# Patient Record
Sex: Female | Born: 1975 | Race: White | Hispanic: No | Smoking: Never smoker
Health system: Southern US, Community
[De-identification: ages and names within clinical notes are randomized; demographics above are authoritative.]

## PROBLEM LIST (undated history)

## (undated) DIAGNOSIS — Z789 Other specified health status: Secondary | ICD-10-CM

## (undated) HISTORY — PX: BREAST ENHANCEMENT SURGERY: SHX7

## (undated) HISTORY — PX: OTHER SURGICAL HISTORY: SHX169

---

## 1998-11-27 ENCOUNTER — Inpatient Hospital Stay (HOSPITAL_COMMUNITY): Admission: AD | Admit: 1998-11-27 | Discharge: 1998-11-27 | Payer: Self-pay | Admitting: Obstetrics and Gynecology

## 1998-11-27 ENCOUNTER — Inpatient Hospital Stay (HOSPITAL_COMMUNITY): Admission: AD | Admit: 1998-11-27 | Discharge: 1998-11-30 | Payer: Self-pay | Admitting: Obstetrics and Gynecology

## 1999-01-02 ENCOUNTER — Other Ambulatory Visit: Admission: RE | Admit: 1999-01-02 | Discharge: 1999-01-02 | Payer: Self-pay | Admitting: Obstetrics and Gynecology

## 1999-01-21 ENCOUNTER — Encounter (HOSPITAL_COMMUNITY): Admission: RE | Admit: 1999-01-21 | Discharge: 1999-04-21 | Payer: Self-pay | Admitting: Obstetrics and Gynecology

## 1999-01-22 ENCOUNTER — Encounter: Payer: Self-pay | Admitting: Family Medicine

## 1999-01-22 ENCOUNTER — Ambulatory Visit (HOSPITAL_COMMUNITY): Admission: RE | Admit: 1999-01-22 | Discharge: 1999-01-22 | Payer: Self-pay | Admitting: Family Medicine

## 2000-01-06 ENCOUNTER — Other Ambulatory Visit: Admission: RE | Admit: 2000-01-06 | Discharge: 2000-01-06 | Payer: Self-pay | Admitting: Obstetrics and Gynecology

## 2000-11-30 ENCOUNTER — Other Ambulatory Visit: Admission: RE | Admit: 2000-11-30 | Discharge: 2000-11-30 | Payer: Self-pay | Admitting: Family Medicine

## 2002-01-18 ENCOUNTER — Other Ambulatory Visit: Admission: RE | Admit: 2002-01-18 | Discharge: 2002-01-18 | Payer: Self-pay | Admitting: Family Medicine

## 2003-10-17 ENCOUNTER — Other Ambulatory Visit: Admission: RE | Admit: 2003-10-17 | Discharge: 2003-10-17 | Payer: Self-pay | Admitting: Obstetrics and Gynecology

## 2004-05-07 ENCOUNTER — Inpatient Hospital Stay (HOSPITAL_COMMUNITY): Admission: AD | Admit: 2004-05-07 | Discharge: 2004-05-09 | Payer: Self-pay | Admitting: Obstetrics and Gynecology

## 2004-06-12 ENCOUNTER — Other Ambulatory Visit: Admission: RE | Admit: 2004-06-12 | Discharge: 2004-06-12 | Payer: Self-pay | Admitting: Obstetrics and Gynecology

## 2005-08-25 ENCOUNTER — Other Ambulatory Visit: Admission: RE | Admit: 2005-08-25 | Discharge: 2005-08-25 | Payer: Self-pay | Admitting: Family Medicine

## 2006-09-21 ENCOUNTER — Other Ambulatory Visit: Admission: RE | Admit: 2006-09-21 | Discharge: 2006-09-21 | Payer: Self-pay | Admitting: Family Medicine

## 2007-10-21 ENCOUNTER — Other Ambulatory Visit: Admission: RE | Admit: 2007-10-21 | Discharge: 2007-10-21 | Payer: Self-pay | Admitting: Family Medicine

## 2008-03-02 ENCOUNTER — Encounter: Admission: RE | Admit: 2008-03-02 | Discharge: 2008-03-02 | Payer: Self-pay | Admitting: Gastroenterology

## 2008-03-15 ENCOUNTER — Emergency Department (HOSPITAL_COMMUNITY): Admission: EM | Admit: 2008-03-15 | Discharge: 2008-03-15 | Payer: Self-pay | Admitting: Emergency Medicine

## 2008-03-19 ENCOUNTER — Emergency Department (HOSPITAL_COMMUNITY): Admission: EM | Admit: 2008-03-19 | Discharge: 2008-03-19 | Payer: Self-pay | Admitting: Emergency Medicine

## 2009-03-19 IMAGING — CT CT ANGIO CHEST
1 of 2 series · 20 of 30 positions shown · IV contrast (APPLIED)
Comparison: Report of a prior study 01/22/1999

CLINICAL DATA: Chest pain.

CT ANGIOGRAPHY CHEST
TECHNIQUE: Multidetector CT imaging of the chest using the
standard protocol during bolus administration of intravenous
contrast. Multiplanar reconstructed images obtained and reviewed to
evaluate the vascular anatomy.
Contrast: 80 ml 0mnipaque-DUU

[Series 6: pe 1.0 b20f st · axial · 0.63mm/px · z∈[+1069,+1359]mm · 20 of 324 slices shown]
[im 17/324  lung]
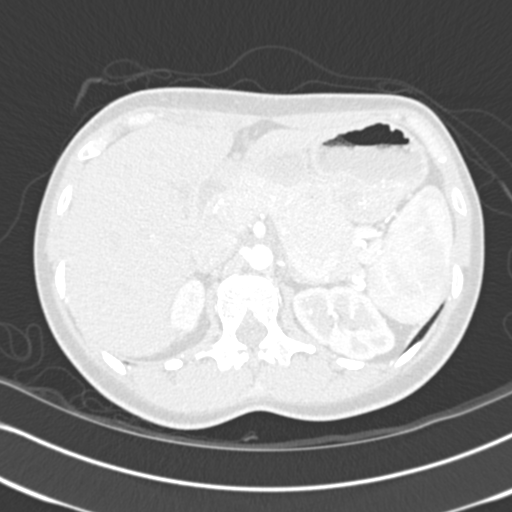
[im 33/324  mediastinal]
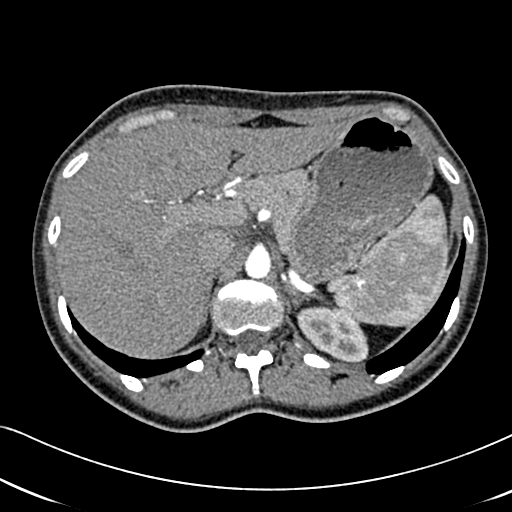
[im 49/324  lung]
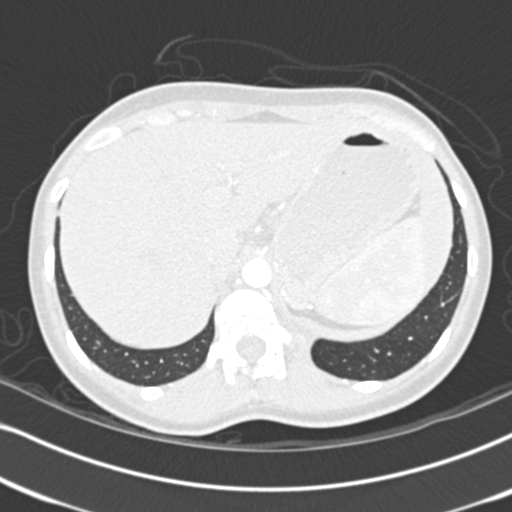
[im 65/324  mediastinal]
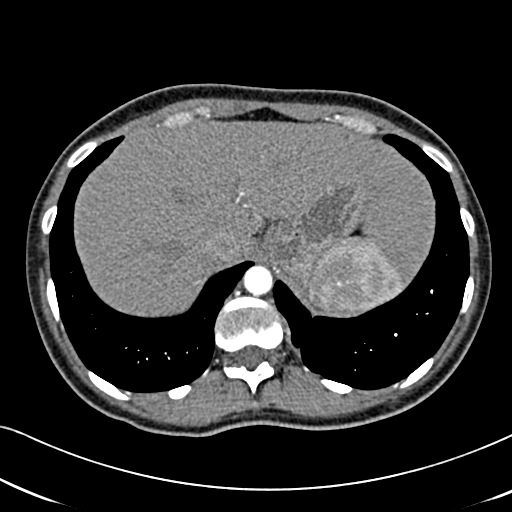
[im 81/324  lung]
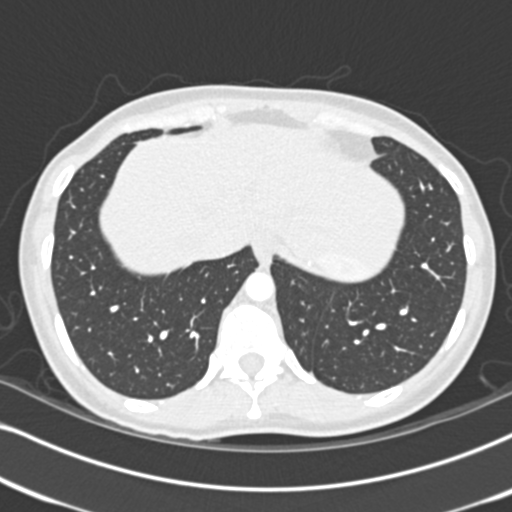
[im 97/324  mediastinal]
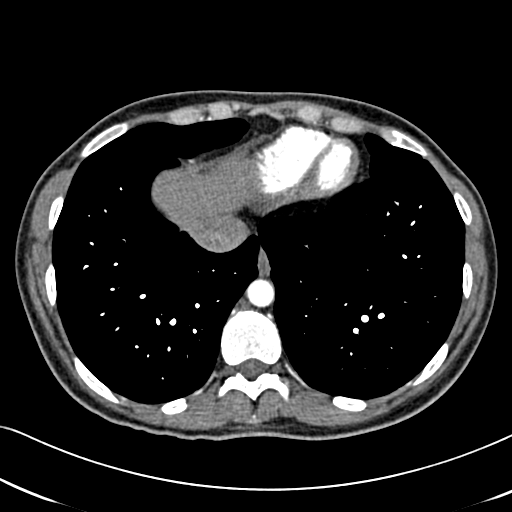
[im 114/324  lung]
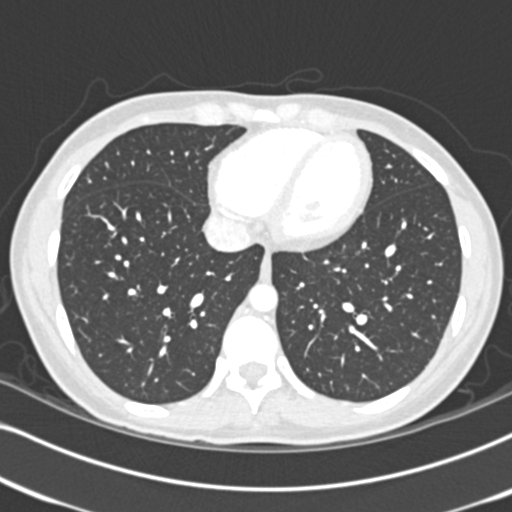
[im 130/324  mediastinal]
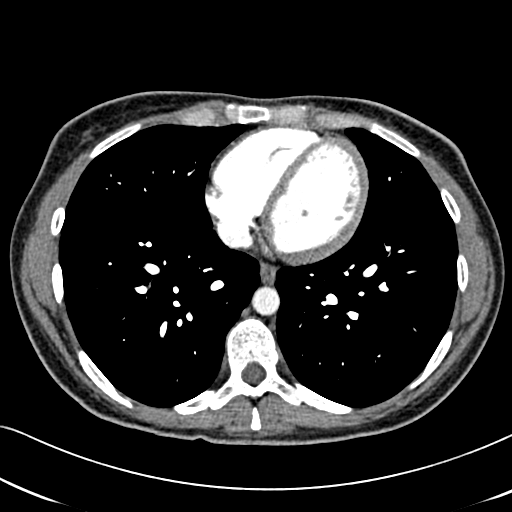
[im 146/324  lung]
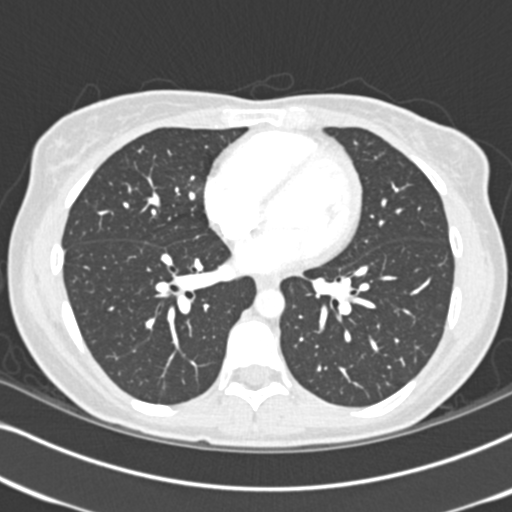
[im 153/324  mediastinal]
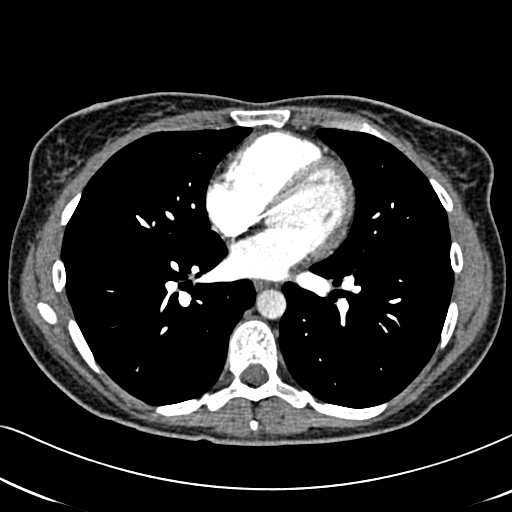
[im 162/324  lung]
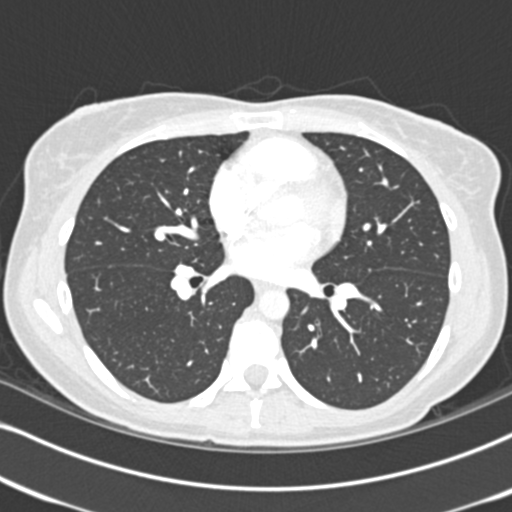
[im 178/324  mediastinal]
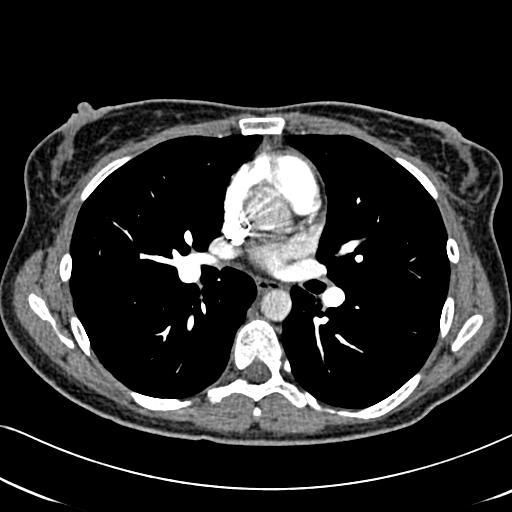
[im 194/324  lung]
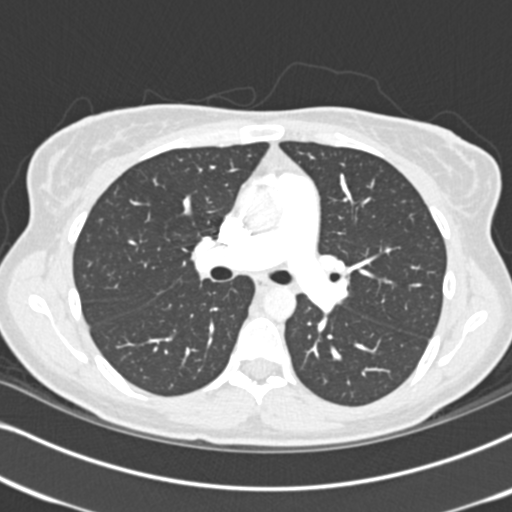
[im 210/324  mediastinal]
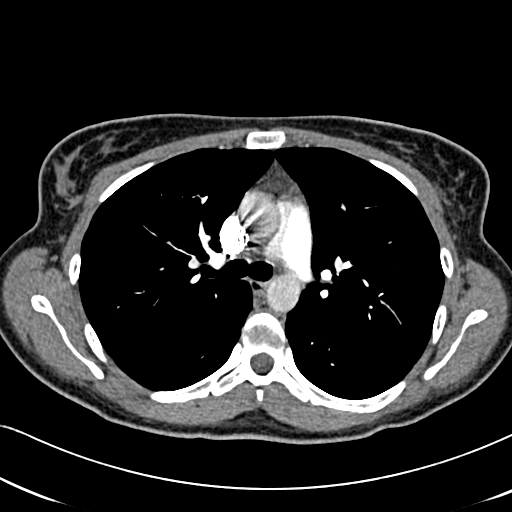
[im 227/324  lung]
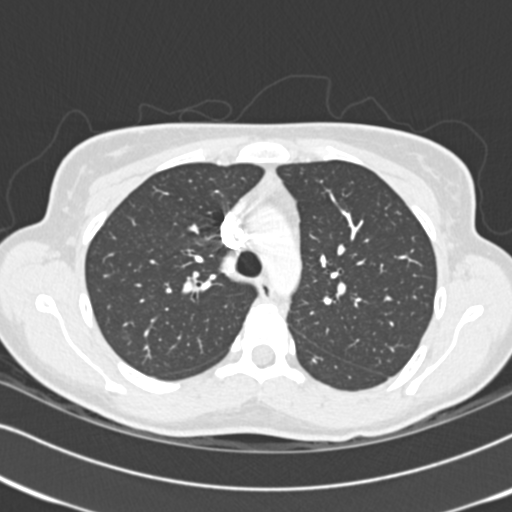
[im 243/324  mediastinal]
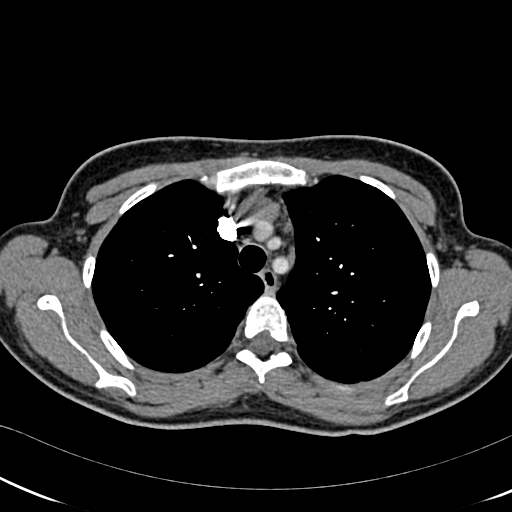
[im 259/324  lung]
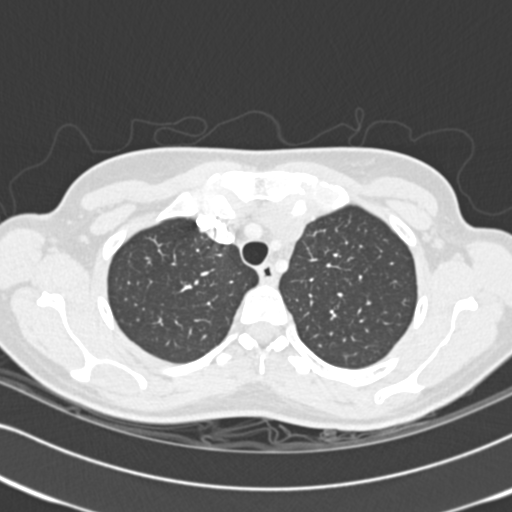
[im 275/324  mediastinal]
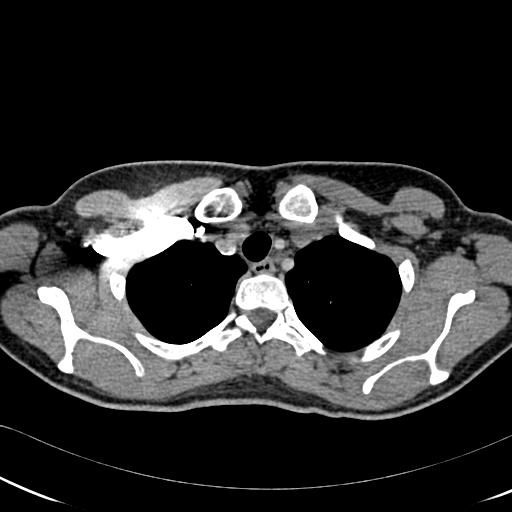
[im 291/324  lung]
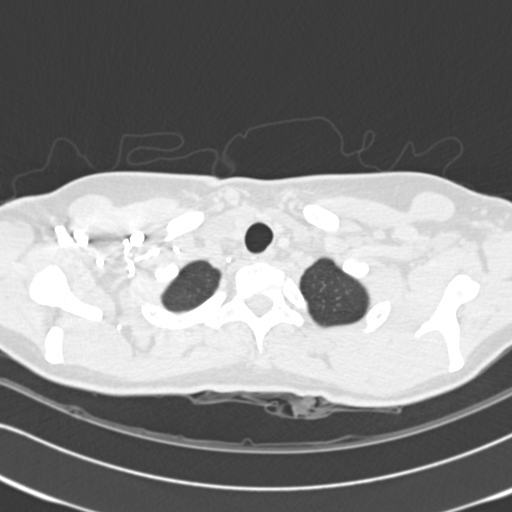
[im 307/324  mediastinal]
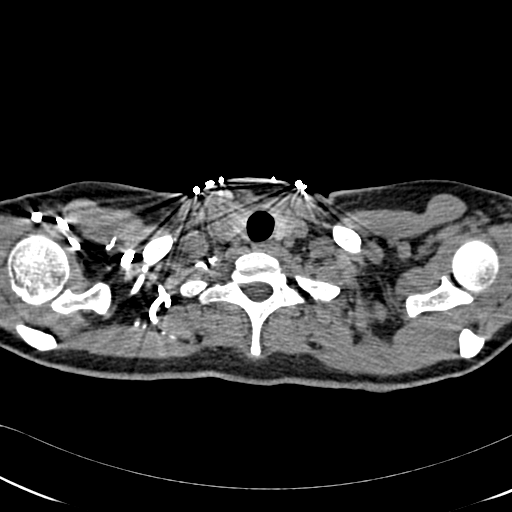

[20 of 30 positions shown; findings below may reference images not displayed]

FINDINGS: In three planes there are no pulmonary emboli.  No
pleural or pericardial fluid.  No mediastinal or hilar adenopathy.
No enlarged axillary nodes or chest wall masses.

No dissection or focal aneurysm of the thoracic aorta. The lungs
are clear.  No osseous lesions.
IMPRESSION: 1.  No pulmonary emboli.
2.  Aorta normal.
3.  Lungs clear.
4.  No other acute or significant findings.

## 2010-04-01 ENCOUNTER — Other Ambulatory Visit: Admission: RE | Admit: 2010-04-01 | Discharge: 2010-04-01 | Payer: Self-pay | Admitting: Family Medicine

## 2010-05-08 ENCOUNTER — Encounter: Admission: RE | Admit: 2010-05-08 | Discharge: 2010-05-29 | Payer: Self-pay | Admitting: Family Medicine

## 2010-08-25 ENCOUNTER — Encounter: Payer: Self-pay | Admitting: Gastroenterology

## 2010-09-04 ENCOUNTER — Encounter: Payer: Self-pay | Admitting: Orthopedic Surgery

## 2010-10-08 ENCOUNTER — Other Ambulatory Visit: Payer: Self-pay | Admitting: Orthopedic Surgery

## 2010-10-08 DIAGNOSIS — M25532 Pain in left wrist: Secondary | ICD-10-CM

## 2010-10-11 ENCOUNTER — Ambulatory Visit
Admission: RE | Admit: 2010-10-11 | Discharge: 2010-10-11 | Disposition: A | Discharge status: Home / Self Care | Payer: BC Managed Care – PPO | Source: Ambulatory Visit | Attending: Orthopedic Surgery | Admitting: Orthopedic Surgery

## 2010-10-11 DIAGNOSIS — M25532 Pain in left wrist: Secondary | ICD-10-CM

## 2011-05-02 LAB — D-DIMER, QUANTITATIVE: D-Dimer, Quant: 1.8 — ABNORMAL HIGH

## 2012-01-08 ENCOUNTER — Emergency Department (HOSPITAL_COMMUNITY)
Admission: EM | Admit: 2012-01-08 | Discharge: 2012-01-08 | Disposition: A | Discharge status: Home / Self Care | Payer: BC Managed Care – PPO | Attending: Emergency Medicine | Admitting: Emergency Medicine

## 2012-01-08 ENCOUNTER — Emergency Department (HOSPITAL_COMMUNITY): Payer: BC Managed Care – PPO

## 2012-01-08 ENCOUNTER — Encounter (HOSPITAL_COMMUNITY): Payer: Self-pay | Admitting: Emergency Medicine

## 2012-01-08 DIAGNOSIS — R079 Chest pain, unspecified: Secondary | ICD-10-CM | POA: Insufficient documentation

## 2012-01-08 DIAGNOSIS — E876 Hypokalemia: Secondary | ICD-10-CM

## 2012-01-08 DIAGNOSIS — R10819 Abdominal tenderness, unspecified site: Secondary | ICD-10-CM | POA: Insufficient documentation

## 2012-01-08 DIAGNOSIS — K2970 Gastritis, unspecified, without bleeding: Secondary | ICD-10-CM

## 2012-01-08 DIAGNOSIS — R109 Unspecified abdominal pain: Secondary | ICD-10-CM

## 2012-01-08 LAB — HEPATIC FUNCTION PANEL
ALT: 32 U/L (ref 0–35)
AST: 48 U/L — ABNORMAL HIGH (ref 0–37)
Alkaline Phosphatase: 42 U/L (ref 39–117)
Bilirubin, Direct: 0.2 mg/dL (ref 0.0–0.3)
Indirect Bilirubin: 0.4 mg/dL (ref 0.3–0.9)
Total Protein: 6.8 g/dL (ref 6.0–8.3)

## 2012-01-08 LAB — DIFFERENTIAL
Basophils Relative: 0 % (ref 0–1)
Eosinophils Absolute: 0 10*3/uL (ref 0.0–0.7)
Lymphocytes Relative: 11 % — ABNORMAL LOW (ref 12–46)
Monocytes Absolute: 0.3 10*3/uL (ref 0.1–1.0)
Monocytes Relative: 2 % — ABNORMAL LOW (ref 3–12)
Neutro Abs: 12.5 10*3/uL — ABNORMAL HIGH (ref 1.7–7.7)
Neutrophils Relative %: 86 % — ABNORMAL HIGH (ref 43–77)

## 2012-01-08 LAB — BASIC METABOLIC PANEL
BUN: 13 mg/dL (ref 6–23)
Creatinine, Ser: 0.83 mg/dL (ref 0.50–1.10)
GFR calc Af Amer: >90 mL/min (ref >90)
GFR calc non Af Amer: >90 mL/min (ref >90)
Glucose, Bld: 125 mg/dL — ABNORMAL HIGH (ref 70–99)

## 2012-01-08 LAB — CBC
Platelets: 176 10*3/uL (ref 150–400)
RDW: 13.1 % (ref 11.5–15.5)
WBC: 14.6 10*3/uL — ABNORMAL HIGH (ref 4.0–10.5)

## 2012-01-08 LAB — POCT PREGNANCY, URINE: Preg Test, Ur: NEGATIVE

## 2012-01-08 LAB — LIPASE, BLOOD: Lipase: 63 U/L — ABNORMAL HIGH (ref 11–59)

## 2012-01-08 MED ORDER — POTASSIUM CHLORIDE 10 MEQ/100ML IV SOLN
10.0000 meq | Freq: Once | INTRAVENOUS | Status: AC
  Administered 2012-01-08: 10 meq via INTRAVENOUS
  Filled 2012-01-08: qty 100

## 2012-01-08 MED ORDER — LANSOPRAZOLE 30 MG PO TBDP: 30.0000 mg | ORAL_TABLET | Freq: Every day | ORAL | Status: DC

## 2012-01-08 MED ORDER — POTASSIUM CHLORIDE CRYS ER 20 MEQ PO TBCR
40.0000 meq | EXTENDED_RELEASE_TABLET | Freq: Once | ORAL | Status: AC
  Administered 2012-01-08: 40 meq via ORAL
  Filled 2012-01-08: qty 2

## 2012-01-08 MED ORDER — PANTOPRAZOLE SODIUM 40 MG IV SOLR
40.0000 mg | Freq: Once | INTRAVENOUS | Status: AC
  Administered 2012-01-08: 40 mg via INTRAVENOUS
  Filled 2012-01-08: qty 40

## 2012-01-08 MED ORDER — POTASSIUM CHLORIDE CRYS ER 20 MEQ PO TBCR: 40.0000 meq | EXTENDED_RELEASE_TABLET | Freq: Every day | ORAL | Status: DC

## 2012-01-08 MED ORDER — MORPHINE SULFATE 4 MG/ML IJ SOLN
4.0000 mg | Freq: Once | INTRAMUSCULAR | Status: AC
  Administered 2012-01-08: 4 mg via INTRAVENOUS
  Filled 2012-01-08: qty 1

## 2012-01-08 MED ORDER — IOHEXOL 300 MG/ML  SOLN
80.0000 mL | Freq: Once | INTRAMUSCULAR | Status: AC | PRN
  Administered 2012-01-08: 80 mL via INTRAVENOUS

## 2012-01-08 MED ORDER — ONDANSETRON HCL 4 MG/2ML IJ SOLN
4.0000 mg | Freq: Once | INTRAMUSCULAR | Status: AC
  Administered 2012-01-08: 4 mg via INTRAVENOUS
  Filled 2012-01-08: qty 2

## 2012-01-08 MED ORDER — IOHEXOL 300 MG/ML  SOLN
20.0000 mL | INTRAMUSCULAR | Status: AC
  Administered 2012-01-08: 20 mL via ORAL

## 2012-01-08 MED ORDER — LORAZEPAM 2 MG/ML IJ SOLN
1.0000 mg | Freq: Once | INTRAMUSCULAR | Status: AC
  Administered 2012-01-08: 1 mg via INTRAVENOUS
  Filled 2012-01-08: qty 1

## 2012-01-08 NOTE — ED Notes (Signed)
PT. REPORTS MID CHEST PAIN WITH NAUSEA ONSET THIS EVENING , DENIES SOB /COUGH OR DIAPHORESIS.

## 2012-01-08 NOTE — ED Notes (Signed)
Took patient IV out and unhook the monitor and patient is getting dressed

## 2012-01-08 NOTE — ED Notes (Signed)
Patient transported to X-ray 

## 2012-01-08 NOTE — ED Notes (Signed)
States had indigestion like pain last night when she went to bed around 1130 after eating a peanut butter sandwich grilled in butter. States awoke to go to the restroom around 2 am and felt nauseated but did not vomit. States pain became worse and so she came to the ed to be evaluated

## 2012-01-08 NOTE — ED Notes (Signed)
Pt to come to cdu to finish receiving her iv potassium

## 2012-01-08 NOTE — Discharge Instructions (Signed)
Gastritis Gastritis is an inflammation (the body's way of reacting to injury and/or infection) of the stomach. It is often caused by viral or bacterial (germ) infections. It can also be caused by chemicals (including alcohol) and medications. This illness may be associated with generalized malaise (feeling tired, not well), cramps, and fever. The illness may last 2 to 7 days. If symptoms of gastritis continue, gastroscopy (looking into the stomach with a telescope-like instrument), biopsy (taking tissue samples), and/or blood tests may be necessary to determine the cause. Antibiotics will not affect the illness unless there is a bacterial infection present. One common bacterial cause of gastritis is an organism known as H. Pylori. This can be treated with antibiotics. Other forms of gastritis are caused by too much acid in the stomach. They can be treated with medications such as H2 blockers and antacids. Home treatment is usually all that is needed. Young children will quickly become dehydrated (loss of body fluids) if vomiting and diarrhea are both present. Medications may be given to control nausea. Medications are usually not given for diarrhea unless especially bothersome. Some medications slow the removal of the virus from the gastrointestinal tract. This slows down the healing process. HOME CARE INSTRUCTIONS Home care instructions for nausea and vomiting:  For adults: drink small amounts of fluids often. Drink at least 2 quarts a day. Take sips frequently. Do not drink large amounts of fluid at one time. This may worsen the nausea.   Only take over-the-counter or prescription medicines for pain, discomfort, or fever as directed by your caregiver.   Drink clear liquids only. Those are anything you can see through such as water, broth, or soft drinks.   Once you are keeping clear liquids down, you may start full liquids, soups, juices, and ice cream or sherbet. Slowly add bland (plain, not spicy)  foods to your diet.  Home care instructions for diarrhea:  Diarrhea can be caused by bacterial infections or a virus. Your condition should improve with time, rest, fluids, and/or anti-diarrheal medication.   Until your diarrhea is under control, you should drink clear liquids often in small amounts. Clear liquids include: water, broth, jell-o water and weak tea.  Avoid:  Milk.   Fruits.   Tobacco.   Alcohol.   Extremely hot or cold fluids.   Too much intake of anything at one time.  When your diarrhea stops you may add the following foods, which help the stool to become more formed:  Rice.   Bananas.   Apples without skin.   Dry toast.  Once these foods are tolerated you may add low-fat yogurt and low-fat cottage cheese. They will help to restore the normal bacterial balance in your bowel. Wash your hands well to avoid spreading bacteria (germ) or virus. SEEK IMMEDIATE MEDICAL CARE IF:   You are unable to keep fluids down.   Vomiting or diarrhea become persistent (constant).   Abdominal pain develops, increases, or localizes. (Right sided pain can be appendicitis. Left sided pain in adults can be diverticulitis.)   You develop a fever (an oral temperature above 102 F (38.9 C)).   Diarrhea becomes excessive or contains blood or mucus.   You have excessive weakness, dizziness, fainting or extreme thirst.   You are not improving or you are getting worse.   You have any other questions or concerns.  Document Released: 07/15/2001 Document Revised: 07/10/2011 Document Reviewed: 07/21/2005 [facility] Patient Information 2012 [place], [place].Foods Rich in Potassium The body needs potassium to:  Control blood pressure.   Keep the muscles healthy.   Keep the nervous system healthy.  Most foods contain potassium. Eating a variety of foods in the right amounts will help control the level of potassium in your body.  Food / Potassium (mg)  Apricots, dried,  cup / 378 mg    Apricots, raw, 1 cup halves / 401 mg   Avocado,  / 487 mg   Banana, 1 large / 487 mg   Beef, lean, round, 3 oz / 202 mg   Cantaloupe, 1 cup cubes / 427 mg   Dates, medjool, 5 whole / 835 mg   Ham, cured, 3 oz / 212 mg   Lentils, dried,  cup / 458 mg   Lima beans, frozen,  cup / 258 mg   Orange, 1 large / 333 mg   Orange juice, 1 cup / 443 mg   Peaches, dried,  cup / 398 mg   Peas, split, cooked,  cup / 355 mg   Potato, boiled, 1 medium / 515 mg   Prunes, dried, uncooked,  cup / 318 mg   Raisins,  cup / 309 mg   Salmon, pink, raw, 3 oz / 275 mg   Sardines, canned , 3 oz / 338 mg   Tomato, raw, 1 medium / 292 mg   Tomato juice, 6 oz / 417 mg   [place], 3 oz / 349 mg  Other Foods High in Potassium (greater than 250 mg):  Bran cereals and other bran products.   Milk (skim, 1%, 2%, whole).   Buttermilk.   Yogurt.   Nuts.   Dried fruits.   Cherries.   Sweet potatoes.   Oranges.   Baked Beans.   Broccoli.   Spinach.   Peanut butter.   Tofu.  Foods Lower in Potassium (less than 250 mg):  Pasta.   Rice.   Cottage cheese.   Cheddar cheese.   Apples.   Mango.   Grapes.   Grapefruit.   Pineapple.   Raspberries.   Strawberries.   Watermelon.   Green Beans.   Cabbage.   Carrots.   Cauliflower.   Celery.   Corn.    Mushrooms.   Onions.   Squash.   Eggs.  The list below tells you how big or small some common portion sizes are:  1 oz.........4 stacked dice.   3 oz........Marland KitchenDeck of cards.   1 tsp.......Marland KitchenTip of little finger.   1 tbs......Marland KitchenMarland KitchenThumb.   2 tbs.......Marland KitchenGolf ball.    cup......Marland KitchenHalf of a fist.   1 cup.......Marland KitchenA fist.  Document Released: 01/07/2008 Document Revised: 04/02/2011 Document Reviewed: 12/04/2008 [facility] Patient Information 2012 [place], [place].

## 2012-01-08 NOTE — ED Provider Notes (Signed)
History     CSN: [id number]  Arrival date & time 01/08/12  7564   First MD Initiated Contact with Patient 01/08/12 0354      Chief Complaint  Patient presents with  . Chest Pain    (Consider location/radiation/quality/duration/timing/severity/associated sxs/prior treatment) Patient is a 36 y.o. female presenting with chest pain. The history is provided by the patient.  Chest Pain    patient here with epigastric pain x2 days. Notes that she did have a grilled peanut butter sandwich prior to the pain getting worse. Some nausea but no vomiting. Denies any fever. No urinary symptoms. No vaginal bleeding or discharge. No prior history of same. No change in bowel habits. No medications taken for this prior to arrival. Nothing seems to make her symptoms better  History reviewed. No pertinent past medical history.  Past Surgical History  Procedure Date  . Breast enhancement surgery   . Tummy tuck     No family history on file.  History  Substance Use Topics  . Smoking status: Never Smoker   . Smokeless tobacco: Not on file  . Alcohol Use: Yes    OB History    Grav Para Term Preterm Abortions TAB SAB Ect Mult Living                  Review of Systems  Cardiovascular: Positive for chest pain.  All other systems reviewed and are negative.    Allergies  Review of patient's allergies indicates no known allergies.  Home Medications   Current Outpatient Rx  Name Route Sig Dispense Refill  . ADULT MULTIVITAMIN W/MINERALS CH Oral Take 1 tablet by mouth daily.    . TOPIRAMATE 200 MG PO TABS Oral Take 200 mg by mouth at bedtime.      BP 112/43  Pulse 80  Temp(Src) 98 F (36.7 C) (Oral)  Resp 18  SpO2 100%  LMP 12/30/2011  Physical Exam  Nursing note and vitals reviewed. Constitutional: She is oriented to person, place, and time. She appears well-developed and well-nourished.  Non-toxic appearance. No distress.  HENT:  Head: Normocephalic and atraumatic.  Eyes:  Conjunctivae, EOM and lids are normal. Pupils are equal, round, and reactive to light.  Neck: Normal range of motion. Neck supple. No tracheal deviation present. No mass present.  Cardiovascular: Normal rate, regular rhythm and normal heart sounds.  Exam reveals no gallop.   No murmur heard. Pulmonary/Chest: Effort normal and breath sounds normal. No stridor. No respiratory distress. She has no decreased breath sounds. She has no wheezes. She has no rhonchi. She has no rales.  Abdominal: Soft. Normal appearance and bowel sounds are normal. She exhibits no distension. There is tenderness in the right upper quadrant, epigastric area and left upper quadrant. There is no rigidity, no rebound, no guarding and no CVA tenderness.  Musculoskeletal: Normal range of motion. She exhibits no edema and no tenderness.  Neurological: She is alert and oriented to person, place, and time. She has normal strength. No cranial nerve deficit or sensory deficit. GCS eye subscore is 4. GCS verbal subscore is 5. GCS motor subscore is 6.  Skin: Skin is warm and dry. No abrasion and no rash noted.  Psychiatric: She has a normal mood and affect. Her speech is normal and behavior is normal.    ED Course  Procedures (including critical care time)  Labs Reviewed  CBC - Abnormal; Notable for the following:    WBC 14.6 (*)    All other components  within normal limits  DIFFERENTIAL - Abnormal; Notable for the following:    Neutrophils Relative 86 (*)    Neutro Abs 12.5 (*)    Lymphocytes Relative 11 (*)    Monocytes Relative 2 (*)    All other components within normal limits  POCT I-STAT TROPONIN I  BASIC METABOLIC PANEL  LIPASE, BLOOD  HEPATIC FUNCTION PANEL   Dg Chest 2 View  01/08/2012  *RADIOLOGY REPORT*  Clinical Data: Chest pain.  CHEST - 2 VIEW  Comparison: Chest CT 03/15/2008.  Findings: The cardiac silhouette, mediastinal and hilar contours are within normal limits.  The lungs are clear.  No pleural effusion.   The bony thorax is intact.  IMPRESSION: Normal chest x-ray.  Original Report Authenticated By: P. Loralie Champagne, M.D.     No diagnosis found.    MDM  Patient given IV fluids and pain medications. Patient has a leukocytosis and now more diffuse abdominal pain. Abdominal CT is pending.        Toy Baker, MD 01/08/12 [phone]

## 2012-01-08 NOTE — ED Provider Notes (Signed)
The patient was seen by Dr. Freida Busman last evening for trouble with abdominal pain associated with nausea and chest discomfort. The patient was noted to have upper abdominal pain on exam and she had an elevated white blood cell count. I was asked to followup on the CAT scan results. On my repeat exam this morning she has no abdominal tenderness in the right upper quadrant or epigastrium. I doubt that she is having acute cholecystitis. She does have slight elevation in her lipase but there is no evidence of swelling about the pancreas on CT scan. She does have some inflammation in the gastric region. 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 I suspect that her pain may be related to that. The patient will be started on antacids. She has been given potassium replacement here in the emergency department. I've instructed her to follow up with her primary care doctor and her gastroenterologist Dr. Evette Cristal. She may benefit from outpatient endoscopy.  Celene Kras, MD 01/08/12 1044

## 2012-01-08 NOTE — ED Notes (Signed)
Received pt. From triage via w/c, pt. Alert and oriented, NAD noted

## 2012-01-09 LAB — URINE CULTURE
Colony Count: NO GROWTH
Culture  Setup Time: 201306060628

## 2014-01-11 ENCOUNTER — Other Ambulatory Visit: Payer: Self-pay | Admitting: Family Medicine

## 2014-01-11 ENCOUNTER — Other Ambulatory Visit (HOSPITAL_COMMUNITY)
Admission: RE | Admit: 2014-01-11 | Discharge: 2014-01-11 | Disposition: A | Discharge status: Home / Self Care | Payer: BC Managed Care – PPO | Source: Ambulatory Visit | Attending: Family Medicine | Admitting: Family Medicine

## 2014-01-11 DIAGNOSIS — Z113 Encounter for screening for infections with a predominantly sexual mode of transmission: Secondary | ICD-10-CM | POA: Insufficient documentation

## 2014-01-11 DIAGNOSIS — Z1151 Encounter for screening for human papillomavirus (HPV): Secondary | ICD-10-CM | POA: Insufficient documentation

## 2014-01-11 DIAGNOSIS — Z124 Encounter for screening for malignant neoplasm of cervix: Secondary | ICD-10-CM | POA: Insufficient documentation

## 2014-01-16 LAB — CYTOLOGY - PAP

## 2017-12-16 ENCOUNTER — Other Ambulatory Visit: Payer: Self-pay

## 2019-12-19 ENCOUNTER — Ambulatory Visit: Payer: Self-pay | Attending: Internal Medicine

## 2019-12-19 DIAGNOSIS — Z23 Encounter for immunization: Secondary | ICD-10-CM

## 2019-12-19 NOTE — Progress Notes (Signed)
   Covid-19 Vaccination Clinic  Name:  Marissa Cordova    MRN: [id number] DOB: 05-Nov-1975  12/19/2019  [name] was observed post Covid-19 immunization for 15 minutes without incident. She was provided with Vaccine Information Sheet and instruction to access the V-Safe system.   [name] was instructed to call 911 with any severe reactions post vaccine: Marland Kitchen Difficulty breathing  . Swelling of face and throat  . A fast heartbeat  . A bad rash all over body  . Dizziness and weakness   Immunizations Administered    Name Date Dose VIS Date Route   Pfizer COVID-19 Vaccine 12/19/2019  4:58 PM 0.3 mL 09/28/2018 Intramuscular   Manufacturer: [employer], [employer]   Lot: [id number]   NDC: [address]-3

## 2020-12-25 ENCOUNTER — Encounter (HOSPITAL_BASED_OUTPATIENT_CLINIC_OR_DEPARTMENT_OTHER): Payer: Self-pay | Admitting: Orthopedic Surgery

## 2020-12-25 ENCOUNTER — Other Ambulatory Visit: Payer: Self-pay | Admitting: Orthopedic Surgery

## 2020-12-25 ENCOUNTER — Other Ambulatory Visit: Payer: Self-pay

## 2020-12-27 ENCOUNTER — Ambulatory Visit (HOSPITAL_BASED_OUTPATIENT_CLINIC_OR_DEPARTMENT_OTHER): Payer: No Typology Code available for payment source | Admitting: Anesthesiology

## 2020-12-27 ENCOUNTER — Ambulatory Visit (HOSPITAL_BASED_OUTPATIENT_CLINIC_OR_DEPARTMENT_OTHER)
Admission: RE | Admit: 2020-12-27 | Discharge: 2020-12-27 | Disposition: A | Discharge status: Home / Self Care | Payer: No Typology Code available for payment source | Attending: Orthopedic Surgery | Admitting: Orthopedic Surgery

## 2020-12-27 ENCOUNTER — Encounter (HOSPITAL_BASED_OUTPATIENT_CLINIC_OR_DEPARTMENT_OTHER)
Admission: RE | Disposition: A | Discharge status: Home / Self Care | Payer: Self-pay | Source: Home / Self Care | Attending: Orthopedic Surgery

## 2020-12-27 ENCOUNTER — Other Ambulatory Visit: Payer: Self-pay

## 2020-12-27 ENCOUNTER — Encounter (HOSPITAL_BASED_OUTPATIENT_CLINIC_OR_DEPARTMENT_OTHER): Payer: Self-pay | Admitting: Orthopedic Surgery

## 2020-12-27 DIAGNOSIS — X58XXXA Exposure to other specified factors, initial encounter: Secondary | ICD-10-CM | POA: Insufficient documentation

## 2020-12-27 DIAGNOSIS — S66196A Other injury of flexor muscle, fascia and tendon of right little finger at wrist and hand level, initial encounter: Secondary | ICD-10-CM | POA: Diagnosis not present

## 2020-12-27 DIAGNOSIS — S62636A Displaced fracture of distal phalanx of right little finger, initial encounter for closed fracture: Secondary | ICD-10-CM | POA: Diagnosis not present

## 2020-12-27 HISTORY — PX: OPEN REDUCTION INTERNAL FIXATION (ORIF) DISTAL PHALANX: SHX6236

## 2020-12-27 HISTORY — DX: Other specified health status: Z78.9

## 2020-12-27 LAB — POCT PREGNANCY, URINE: Preg Test, Ur: NEGATIVE

## 2020-12-27 SURGERY — OPEN REDUCTION INTERNAL FIXATION (ORIF) DISTAL PHALANX
Anesthesia: General | Site: Finger | Laterality: Right

## 2020-12-27 MED ORDER — LACTATED RINGERS IV SOLN: INTRAVENOUS | Status: DC

## 2020-12-27 MED ORDER — LACTATED RINGERS IV SOLN: INTRAVENOUS | Status: DC | PRN

## 2020-12-27 MED ORDER — FENTANYL CITRATE (PF) 100 MCG/2ML IJ SOLN
INTRAMUSCULAR | Status: AC
  Filled 2020-12-27: qty 2

## 2020-12-27 MED ORDER — MIDAZOLAM HCL 2 MG/2ML IJ SOLN
INTRAMUSCULAR | Status: AC
  Filled 2020-12-27: qty 2

## 2020-12-27 MED ORDER — PROPOFOL 10 MG/ML IV BOLUS
INTRAVENOUS | Status: AC
  Filled 2020-12-27: qty 20

## 2020-12-27 MED ORDER — HYDROCODONE-ACETAMINOPHEN 5-325 MG PO TABS: ORAL_TABLET | ORAL | 0 refills | Status: AC

## 2020-12-27 MED ORDER — DEXAMETHASONE SODIUM PHOSPHATE 10 MG/ML IJ SOLN
INTRAMUSCULAR | Status: DC | PRN
  Administered 2020-12-27: 5 mg via INTRAVENOUS

## 2020-12-27 MED ORDER — OXYCODONE HCL 5 MG/5ML PO SOLN: 5.0000 mg | Freq: Once | ORAL | Status: DC | PRN

## 2020-12-27 MED ORDER — LIDOCAINE HCL (CARDIAC) PF 100 MG/5ML IV SOSY
PREFILLED_SYRINGE | INTRAVENOUS | Status: DC | PRN
  Administered 2020-12-27: 50 mg via INTRAVENOUS

## 2020-12-27 MED ORDER — OXYCODONE HCL 5 MG PO TABS: 5.0000 mg | ORAL_TABLET | Freq: Once | ORAL | Status: DC | PRN

## 2020-12-27 MED ORDER — CEFAZOLIN SODIUM-DEXTROSE 2-4 GM/100ML-% IV SOLN
INTRAVENOUS | Status: AC
  Filled 2020-12-27: qty 100

## 2020-12-27 MED ORDER — ACETAMINOPHEN 325 MG PO TABS: 325.0000 mg | ORAL_TABLET | ORAL | Status: DC | PRN

## 2020-12-27 MED ORDER — DEXAMETHASONE SODIUM PHOSPHATE 10 MG/ML IJ SOLN
INTRAMUSCULAR | Status: DC | PRN
  Administered 2020-12-27: 10 mg

## 2020-12-27 MED ORDER — PHENYLEPHRINE HCL (PRESSORS) 10 MG/ML IV SOLN
INTRAVENOUS | Status: DC | PRN
  Administered 2020-12-27: 40 ug via INTRAVENOUS

## 2020-12-27 MED ORDER — PROPOFOL 10 MG/ML IV BOLUS
INTRAVENOUS | Status: DC | PRN
  Administered 2020-12-27: 200 mg via INTRAVENOUS

## 2020-12-27 MED ORDER — FENTANYL CITRATE (PF) 100 MCG/2ML IJ SOLN
100.0000 ug | Freq: Once | INTRAMUSCULAR | Status: AC
  Administered 2020-12-27: 100 ug via INTRAVENOUS

## 2020-12-27 MED ORDER — ONDANSETRON HCL 4 MG/2ML IJ SOLN
4.0000 mg | Freq: Once | INTRAMUSCULAR | Status: DC | PRN
Start: 2020-12-27 — End: 2020-12-27

## 2020-12-27 MED ORDER — FENTANYL CITRATE (PF) 100 MCG/2ML IJ SOLN: 25.0000 ug | INTRAMUSCULAR | Status: DC | PRN

## 2020-12-27 MED ORDER — ROPIVACAINE HCL 7.5 MG/ML IJ SOLN
INTRAMUSCULAR | Status: DC | PRN
  Administered 2020-12-27: 20 mL via PERINEURAL

## 2020-12-27 MED ORDER — MIDAZOLAM HCL 2 MG/2ML IJ SOLN
2.0000 mg | Freq: Once | INTRAMUSCULAR | Status: AC
  Administered 2020-12-27: 2 mg via INTRAVENOUS

## 2020-12-27 MED ORDER — ACETAMINOPHEN 160 MG/5ML PO SOLN: 325.0000 mg | ORAL | Status: DC | PRN

## 2020-12-27 MED ORDER — MIDAZOLAM HCL 2 MG/2ML IJ SOLN
INTRAMUSCULAR | Status: DC | PRN
  Administered 2020-12-27: 2 mg via INTRAVENOUS

## 2020-12-27 MED ORDER — MEPERIDINE HCL 25 MG/ML IJ SOLN: 6.2500 mg | INTRAMUSCULAR | Status: DC | PRN

## 2020-12-27 MED ORDER — CEFAZOLIN SODIUM-DEXTROSE 2-4 GM/100ML-% IV SOLN: 2.0000 g | INTRAVENOUS | Status: DC

## 2020-12-27 MED ORDER — FENTANYL CITRATE (PF) 100 MCG/2ML IJ SOLN
INTRAMUSCULAR | Status: DC | PRN
  Administered 2020-12-27 (×2): 25 ug via INTRAVENOUS
  Administered 2020-12-27: 50 ug via INTRAVENOUS

## 2020-12-27 SURGICAL SUPPLY — 53 items
APL PRP STRL LF DISP 70% ISPRP (MISCELLANEOUS) ×1
BLADE SURG 15 STRL LF DISP TIS (BLADE) ×2 IMPLANT
BLADE SURG 15 STRL SS (BLADE) ×4
BNDG CMPR 9X4 STRL LF SNTH (GAUZE/BANDAGES/DRESSINGS) ×1
BNDG ELASTIC 3X5.8 VLCR STR LF (GAUZE/BANDAGES/DRESSINGS) ×2 IMPLANT
BNDG ESMARK 4X9 LF (GAUZE/BANDAGES/DRESSINGS) ×2 IMPLANT
BNDG GAUZE ELAST 4 BULKY (GAUZE/BANDAGES/DRESSINGS) ×2 IMPLANT
CHLORAPREP W/TINT 26 (MISCELLANEOUS) ×2 IMPLANT
CORD BIPOLAR FORCEPS 12FT (ELECTRODE) ×2 IMPLANT
COVER BACK TABLE 60X90IN (DRAPES) ×2 IMPLANT
COVER MAYO STAND STRL (DRAPES) ×2 IMPLANT
COVER WAND RF STERILE (DRAPES) IMPLANT
CUFF TOURN SGL QUICK 18X4 (TOURNIQUET CUFF) ×2 IMPLANT
DRAPE EXTREMITY T 121X128X90 (DISPOSABLE) ×2 IMPLANT
DRAPE OEC MINIVIEW 54X84 (DRAPES) ×2 IMPLANT
DRAPE SURG 17X23 STRL (DRAPES) ×2 IMPLANT
GAUZE SPONGE 4X4 12PLY STRL (GAUZE/BANDAGES/DRESSINGS) ×2 IMPLANT
GAUZE XEROFORM 1X8 LF (GAUZE/BANDAGES/DRESSINGS) ×2 IMPLANT
GLOVE SRG 8 PF TXTR STRL LF DI (GLOVE) ×1 IMPLANT
GLOVE SURG ENC MOIS LTX SZ7.5 (GLOVE) ×2 IMPLANT
GLOVE SURG UNDER POLY LF SZ7 (GLOVE) ×1 IMPLANT
GLOVE SURG UNDER POLY LF SZ8 (GLOVE) ×2
GOWN STRL REUS W/ TWL LRG LVL3 (GOWN DISPOSABLE) ×1 IMPLANT
GOWN STRL REUS W/TWL 2XL LVL3 (GOWN DISPOSABLE) ×1 IMPLANT
GOWN STRL REUS W/TWL LRG LVL3 (GOWN DISPOSABLE)
GOWN STRL REUS W/TWL XL LVL3 (GOWN DISPOSABLE) ×3 IMPLANT
K-WIRE .035X4 (WIRE) ×2 IMPLANT
NDL HYPO 25X1 1.5 SAFETY (NEEDLE) IMPLANT
NEEDLE HYPO 25X1 1.5 SAFETY (NEEDLE) IMPLANT
NS IRRIG 1000ML POUR BTL (IV SOLUTION) ×2 IMPLANT
PACK BASIN DAY SURGERY FS (CUSTOM PROCEDURE TRAY) ×2 IMPLANT
PAD CAST 4YDX4 CTTN HI CHSV (CAST SUPPLIES) ×1 IMPLANT
PADDING CAST COTTON 4X4 STRL (CAST SUPPLIES) ×2
SLEEVE SCD COMPRESS KNEE MED (STOCKING) ×1 IMPLANT
SLING ARM FOAM STRAP LRG (SOFTGOODS) ×1 IMPLANT
SPLINT PLASTER CAST XFAST 3X15 (CAST SUPPLIES) IMPLANT
SPLINT PLASTER CAST XFAST 4X15 (CAST SUPPLIES) IMPLANT
SPLINT PLASTER XTRA FAST SET 4 (CAST SUPPLIES)
SPLINT PLASTER XTRA FASTSET 3X (CAST SUPPLIES)
STOCKINETTE 4X48 STRL (DRAPES) ×2 IMPLANT
SUT CHROMIC 4 0 PS 2 18 (SUTURE) ×1 IMPLANT
SUT ETHILON 3 0 PS 1 (SUTURE) IMPLANT
SUT ETHILON 4 0 PS 2 18 (SUTURE) ×2 IMPLANT
SUT MERSILENE 4 0 P 3 (SUTURE) IMPLANT
SUT POLY BUTTON 15MM (SUTURE) ×1 IMPLANT
SUT PROLENE 2 0 SH DA (SUTURE) ×1 IMPLANT
SUT VIC AB 3-0 PS1 18 (SUTURE)
SUT VIC AB 3-0 PS1 18XBRD (SUTURE) IMPLANT
SUT VICRYL 4-0 PS2 18IN ABS (SUTURE) ×2 IMPLANT
SYR BULB EAR ULCER 3OZ GRN STR (SYRINGE) ×2 IMPLANT
SYR CONTROL 10ML LL (SYRINGE) IMPLANT
TOWEL GREEN STERILE FF (TOWEL DISPOSABLE) ×4 IMPLANT
UNDERPAD 30X36 HEAVY ABSORB (UNDERPADS AND DIAPERS) ×2 IMPLANT

## 2020-12-27 NOTE — Op Note (Signed)
NAME: ELEANORA GUINYARD MEDICAL RECORD NO: [id number] DATE OF BIRTH: 1976/07/20 FACILITY: [facility] LOCATION:  S[facility] CENTER PHYSICIAN: Tami Ribas, MD   OPERATIVE REPORT   DATE OF PROCEDURE: 12/27/20    PREOPERATIVE DIAGNOSIS:   Right small finger distal phalanx intra-articular fracture with FDP avulsion   POSTOPERATIVE DIAGNOSIS:   Right small finger distal phalanx intra-articular fracture with FDP avulsion   PROCEDURE:   1.  Open reduction pin fixation right small finger distal phalanx intra-articular fracture 2.  Repair of right small finger FDP avulsion   SURGEON:  Betha Loa, M.D.   ASSISTANT: Cindee Salt, MD   ANESTHESIA:  General with regional   INTRAVENOUS FLUIDS:  Per anesthesia flow sheet.   ESTIMATED BLOOD LOSS:  Minimal.   COMPLICATIONS:  None.   SPECIMENS:  none   TOURNIQUET TIME:    Total Tourniquet Time Documented: Upper Arm (Right) - 29 minutes Total: Upper Arm (Right) - 29 minutes    DISPOSITION:  Stable to PACU.   INDICATIONS: 45 year old female states over the weekend she sustained injury to the right small finger.  She was seen at urgent care where radiographs were taken revealing small finger distal phalanx fracture.  She was splinted and followed up in the office.  She wished to proceed with operative reduction and fixation with repair of tendon avulsion. Risks, benefits and alternatives of surgery were discussed including the risks of blood loss, infection, damage to nerves, vessels, tendons, ligaments, bone for surgery, need for additional surgery, complications with wound healing, continued pain, nonunion, malunion,  stiffness.  She voiced understanding of these risks and elected to proceed.  OPERATIVE COURSE:  After being identified preoperatively by myself,  the patient and I agreed on the procedure and site of the procedure.  The surgical site was marked.  Surgical consent had been signed. She was given IV antibiotics as  preoperative antibiotic prophylaxis. She was transferred to the operating room and placed on the operating table in supine position with the Right upper extremity on an arm board.  General anesthesia was induced by the anesthesiologist. A regional block had been performed by anesthesia in preoperative holding.   Right upper extremity was prepped and draped in normal sterile orthopedic fashion.  A surgical pause was performed between the surgeons, anesthesia, and operating room staff and all were in agreement as to the patient, procedure, and site of procedure.  Tourniquet at the proximal aspect of the extremity was inflated to 250 mmHg after exsanguination of the arm with an Esmarch bandage.    A Bruner type incision was made at the volar aspect of the finger over the middle and distal phalanges.  This was carried into subcutaneous tissues by spreading technique.  The FDP avulsion was noted.  There was a bony fragment attached.  The fracture site was identified.  Was cleared of hematoma.  It was copiously irrigated with sterile saline.  The fracture was reduced under direct visualization.  A 0.035 inch K wire was advanced from the tip of the finger across the dorsal fracture fragment and across the DIP joint.  C-arm was used in AP and lateral projections to ensure appropriate reduction position of heart which was the case.  A 2-0 Prolene suture was passed in a pullout fashion through the FDP tendon.  This was passed around the distal phalanx and through the nail dorsally.  This was then passed through a bolster and button.  It was tied over the button while holding the  avulsion reduced.  C-arm was used in AP and lateral projections to ensure appropriate reduction position of heart which was the case.  There was good articular reduction and good bony contact.  The wound was copiously irrigated with sterile saline.  It was closed with 4-0 nylon in a horizontal mattress fashion.  The pin was bent and cut short.  The  wound was dressed with sterile Xeroform 4 x 4's and wrapped with a Kerlix bandage.  Dorsal blocking splint was placed with the wrist at approximately 30 to 40 degrees flexion the MPs flexed and the IP is extended.  This was wrapped with Kerlix and Ace bandage.  The tourniquet was deflated at 29 minutes.  Fingertips were pink with brisk capillary refill after deflation of tourniquet.  The operative  drapes were broken down.  The patient was awoken from anesthesia safely.  She was transferred back to the stretcher and taken to PACU in stable condition.  I will see her back in the office in 1 week for postoperative followup.  I will give her a prescription for Norco 5/325 1-2 tabs PO q6 hours prn pain, dispense # 25.   Betha Loa, MD Electronically signed, 12/27/20

## 2020-12-27 NOTE — Anesthesia Procedure Notes (Signed)
Procedure Name: LMA Insertion Performed by: Amelya Mabry M, CRNA Pre-anesthesia Checklist: Patient identified, Emergency Drugs available, Suction available and Patient being monitored Patient Re-evaluated:Patient Re-evaluated prior to induction Oxygen Delivery Method: Circle system utilized Preoxygenation: Pre-oxygenation with 100% oxygen Induction Type: IV induction Ventilation: Mask ventilation without difficulty LMA: LMA inserted LMA Size: 4.0 Number of attempts: 1 Airway Equipment and Method: Bite block Placement Confirmation: positive ETCO2,  CO2 detector and breath sounds checked- equal and bilateral Tube secured with: Tape Dental Injury: Teeth and Oropharynx as per pre-operative assessment        

## 2020-12-27 NOTE — Anesthesia Procedure Notes (Signed)
Anesthesia Regional Block: Axillary brachial plexus block   Pre-Anesthetic Checklist: ,, timeout performed, Correct Patient, Correct Site, Correct Laterality, Correct Procedure, Correct Position, site marked, Risks and benefits discussed,  Surgical consent,  Pre-op evaluation,  At surgeon's request and post-op pain management  Laterality: Right  Prep: chloraprep       Needles:  Injection technique: Single-shot  Needle Type: Echogenic Stimulator Needle     Needle Length: 5cm  Needle Gauge: 22     Additional Needles:   Procedures:, nerve stimulator,,, ultrasound used (permanent image in chart),,,,  Narrative:  Start time: 12/27/2020 1:30 PM End time: 12/27/2020 1:35 PM Injection made incrementally with aspirations every 5 mL.  Performed by: Personally  Anesthesiologist: Bethena Midget, MD  Additional Notes: Functioning IV was confirmed and monitors were applied.  A 40mm 22ga Arrow echogenic stimulator needle was used. Sterile prep and drape,hand hygiene and sterile gloves were used. Ultrasound guidance: relevant anatomy identified, needle position confirmed, local anesthetic spread visualized around nerve(s)., vascular puncture avoided.  Image printed for medical record. Negative aspiration and negative test dose prior to incremental administration of local anesthetic. The patient tolerated the procedure well.

## 2020-12-27 NOTE — Op Note (Signed)
I assisted Surgeon(s) and Role:    * Betha Loa, MD - Primary    Cindee Salt, MD on the Procedure(s): OPEN REDUCTION AND STABILIZATION (ORIF) RIGHT SMALL FINGER; FLEXOR DIGITORUM PROFUNDUS AVULSION on 12/27/2020.  I provided assistance on this case as follows: setup, approach, reduction, stabilization, fixation of the fracture distal phalanx, repair of the flexor tendon, closure and application of the dressing and splint. Electronically signed by: Cindee Salt, MD Date: 12/27/2020 Time: 3:44 PM

## 2020-12-27 NOTE — Discharge Instructions (Addendum)

## 2020-12-27 NOTE — H&P (Signed)
  Marissa Cordova is an 45 y.o. female.   Chief Complaint: finger injury HPI: 45 yo female sustained injury to right small finger 12/22/20.  Seen at [facility] the following day where XR revealed right small finger distal phalanx fracture.  Referred for further care.  XR consistent with fdp avulsion and distal phalanx fracture.  She wishes to proceed with operative reduction and repair.   Allergies: No Known Allergies  Past Medical History:  Diagnosis Date  . Medical history non-contributory     Past Surgical History:  Procedure Laterality Date  . BREAST ENHANCEMENT SURGERY    . TUMMY TUCK      Family History: History reviewed. No pertinent family history.  Social History:   reports that she has never smoked. She has never used smokeless tobacco. She reports current alcohol use. She reports that she does not use drugs.  Medications: No medications prior to admission.    No results found for this or any previous visit (from the past 48 hour(s)).  No results found.   A comprehensive review of systems was negative.  Height 5\' 8"  (1.727 m), weight 76.2 kg, last menstrual period 12/11/2020.  General appearance: alert, cooperative and appears stated age Head: Normocephalic, without obvious abnormality, atraumatic Neck: supple, symmetrical, trachea midline Cardio: regular rate and rhythm Resp: clear to auscultation bilaterally Extremities: Intact sensation and capillary refill all digits.  +epl/fpl/io.  No wounds.  Pulses: 2+ and symmetric Skin: Skin color, texture, turgor normal. No rashes or lesions Neurologic: Grossly normal Incision/Wound: none  Assessment/Plan Right small finger distal phalanx fracture and fdp avulsion.  Non operative and operative treatment options have been discussed with the patient and patient wishes to proceed with operative treatment. Risks, benefits, and alternatives of surgery have been discussed and the patient agrees with the plan of care.   02/10/2021 12/27/2020, 8:44 AM

## 2020-12-27 NOTE — Transfer of Care (Signed)
Immediate Anesthesia Transfer of Care Note  Patient: Marissa Cordova  Procedure(s) Performed: OPEN REDUCTION AND STABILIZATION (ORIF) RIGHT SMALL FINGER; FLEXOR DIGITORUM PROFUNDUS AVULSION (Right Finger)  Patient Location: PACU  Anesthesia Type:General and Regional  Level of Consciousness: awake, alert  and oriented  Airway & Oxygen Therapy: Patient Spontanous Breathing and Patient connected to face mask oxygen  Post-op Assessment: Report given to RN and Post -op Vital signs reviewed and stable  Post vital signs: Reviewed and stable  Last Vitals:  Vitals Value Taken Time  BP    Temp    Pulse 80 12/27/20 1551  Resp 18 12/27/20 1551  SpO2 96 % 12/27/20 1551  Vitals shown include unvalidated device data.  Last Pain:  Vitals:   12/27/20 1330  TempSrc:   PainSc: 0-No pain      Patients Stated Pain Goal: 3 (12/27/20 1315)  Complications: No complications documented.

## 2020-12-27 NOTE — Progress Notes (Signed)
Assisted Dr. Oddono with right, ultrasound guided, axillary block. Side rails up, monitors on throughout procedure. See vital signs in flow sheet. Tolerated Procedure well. 

## 2020-12-27 NOTE — Anesthesia Preprocedure Evaluation (Signed)
Anesthesia Evaluation  Patient identified by MRN, date of birth, ID band Patient awake    Reviewed: Allergy & Precautions, H&P , NPO status , Patient's Chart, lab work & pertinent test results, reviewed documented beta blocker date and time   Airway Mallampati: II  TM Distance: >3 FB Neck ROM: full    Dental no notable dental hx. (+) Teeth Intact, Dental Advisory Given   Pulmonary neg pulmonary ROS,    Pulmonary exam normal breath sounds clear to auscultation       Cardiovascular Exercise Tolerance: Good negative cardio ROS   Rhythm:regular Rate:Normal     Neuro/Psych negative neurological ROS  negative psych ROS   GI/Hepatic negative GI ROS, Neg liver ROS,   Endo/Other  negative endocrine ROS  Renal/GU negative Renal ROS  negative genitourinary   Musculoskeletal   Abdominal   Peds  Hematology negative hematology ROS (+)   Anesthesia Other Findings   Reproductive/Obstetrics negative OB ROS                             Anesthesia Physical Anesthesia Plan  ASA: II  Anesthesia Plan: General   Post-op Pain Management:    Induction: Intravenous  PONV Risk Score and Plan: 3 and Ondansetron and Dexamethasone  Airway Management Planned: LMA  Additional Equipment: None  Intra-op Plan:   Post-operative Plan:   Informed Consent: I have reviewed the patients History and Physical, chart, labs and discussed the procedure including the risks, benefits and alternatives for the proposed anesthesia with the patient or authorized representative who has indicated his/her understanding and acceptance.     Dental Advisory Given  Plan Discussed with: CRNA and Anesthesiologist  Anesthesia Plan Comments: ( )        Anesthesia Quick Evaluation

## 2020-12-28 ENCOUNTER — Encounter (HOSPITAL_BASED_OUTPATIENT_CLINIC_OR_DEPARTMENT_OTHER): Payer: Self-pay | Admitting: Orthopedic Surgery

## 2020-12-28 NOTE — Anesthesia Postprocedure Evaluation (Signed)
Anesthesia Post Note  Patient: Marissa Cordova  Procedure(s) Performed: OPEN REDUCTION AND STABILIZATION (ORIF) RIGHT SMALL FINGER; FLEXOR DIGITORUM PROFUNDUS AVULSION (Right Finger)     Patient location during evaluation: PACU Anesthesia Type: General and Regional Level of consciousness: awake and alert Pain management: pain level controlled Vital Signs Assessment: post-procedure vital signs reviewed and stable Respiratory status: spontaneous breathing, nonlabored ventilation, respiratory function stable and patient connected to nasal cannula oxygen Cardiovascular status: blood pressure returned to baseline and stable Postop Assessment: no apparent nausea or vomiting Anesthetic complications: no   No complications documented.  Last Vitals:  Vitals:   12/27/20 1615 12/27/20 1634  BP: 116/81 118/79  Pulse: 68 63  Resp: 12 14  Temp:  36.5 C  SpO2: 95% 96%    Last Pain:  Vitals:   12/28/20 1113  TempSrc:   PainSc: 0-No pain   Pain Goal: Patients Stated Pain Goal: 3 (12/27/20 1315)                 Georgette Helmer

## 2021-01-21 ENCOUNTER — Other Ambulatory Visit: Payer: Self-pay | Admitting: Obstetrics and Gynecology

## 2021-01-21 DIAGNOSIS — R928 Other abnormal and inconclusive findings on diagnostic imaging of breast: Secondary | ICD-10-CM

## 2021-01-24 ENCOUNTER — Ambulatory Visit: Admission: RE | Admit: 2021-01-24 | Payer: No Typology Code available for payment source | Source: Ambulatory Visit

## 2021-01-24 ENCOUNTER — Other Ambulatory Visit: Payer: Self-pay

## 2021-01-24 ENCOUNTER — Ambulatory Visit
Admission: RE | Admit: 2021-01-24 | Discharge: 2021-01-24 | Disposition: A | Discharge status: Home / Self Care | Payer: No Typology Code available for payment source | Source: Ambulatory Visit | Attending: Obstetrics and Gynecology | Admitting: Obstetrics and Gynecology

## 2021-01-24 DIAGNOSIS — R928 Other abnormal and inconclusive findings on diagnostic imaging of breast: Secondary | ICD-10-CM

## 2021-07-25 ENCOUNTER — Other Ambulatory Visit: Payer: Self-pay | Admitting: Gastroenterology

## 2021-07-25 DIAGNOSIS — R109 Unspecified abdominal pain: Secondary | ICD-10-CM

## 2021-07-30 ENCOUNTER — Ambulatory Visit
Admission: RE | Admit: 2021-07-30 | Discharge: 2021-07-30 | Disposition: A | Discharge status: Home / Self Care | Payer: No Typology Code available for payment source | Source: Ambulatory Visit | Attending: Gastroenterology | Admitting: Gastroenterology

## 2021-07-30 DIAGNOSIS — R109 Unspecified abdominal pain: Secondary | ICD-10-CM

## 2021-07-30 MED ORDER — IOPAMIDOL (ISOVUE-300) INJECTION 61%
100.0000 mL | Freq: Once | INTRAVENOUS | Status: AC | PRN
  Administered 2021-07-30: 14:00:00 100 mL via INTRAVENOUS
